# Patient Record
Sex: Male | Born: 1950 | Race: Black or African American | Hispanic: No | Marital: Married | Smoking: Never smoker
Health system: Southern US, Community
[De-identification: ages and names within clinical notes are randomized; demographics above are authoritative.]

## PROBLEM LIST (undated history)

## (undated) DIAGNOSIS — E119 Type 2 diabetes mellitus without complications: Secondary | ICD-10-CM

## (undated) DIAGNOSIS — E7800 Pure hypercholesterolemia, unspecified: Secondary | ICD-10-CM

## (undated) DIAGNOSIS — I2510 Atherosclerotic heart disease of native coronary artery without angina pectoris: Secondary | ICD-10-CM

## (undated) HISTORY — PX: PROSTATE SURGERY: SHX751

---

## 2018-04-22 ENCOUNTER — Other Ambulatory Visit: Payer: Self-pay

## 2018-04-22 ENCOUNTER — Emergency Department (HOSPITAL_BASED_OUTPATIENT_CLINIC_OR_DEPARTMENT_OTHER)
Admission: EM | Admit: 2018-04-22 | Discharge: 2018-04-22 | Disposition: A | Discharge status: Home / Self Care | Payer: Medicare HMO | Attending: Emergency Medicine | Admitting: Emergency Medicine

## 2018-04-22 ENCOUNTER — Encounter (HOSPITAL_BASED_OUTPATIENT_CLINIC_OR_DEPARTMENT_OTHER): Payer: Self-pay | Admitting: Emergency Medicine

## 2018-04-22 ENCOUNTER — Emergency Department (HOSPITAL_BASED_OUTPATIENT_CLINIC_OR_DEPARTMENT_OTHER): Payer: Medicare HMO

## 2018-04-22 DIAGNOSIS — E119 Type 2 diabetes mellitus without complications: Secondary | ICD-10-CM | POA: Insufficient documentation

## 2018-04-22 DIAGNOSIS — Y998 Other external cause status: Secondary | ICD-10-CM | POA: Diagnosis not present

## 2018-04-22 DIAGNOSIS — Y92410 Unspecified street and highway as the place of occurrence of the external cause: Secondary | ICD-10-CM | POA: Diagnosis not present

## 2018-04-22 DIAGNOSIS — Y9389 Activity, other specified: Secondary | ICD-10-CM | POA: Diagnosis not present

## 2018-04-22 DIAGNOSIS — S0990XA Unspecified injury of head, initial encounter: Secondary | ICD-10-CM | POA: Insufficient documentation

## 2018-04-22 DIAGNOSIS — I2510 Atherosclerotic heart disease of native coronary artery without angina pectoris: Secondary | ICD-10-CM | POA: Diagnosis not present

## 2018-04-22 HISTORY — DX: Pure hypercholesterolemia, unspecified: E78.00

## 2018-04-22 HISTORY — DX: Atherosclerotic heart disease of native coronary artery without angina pectoris: I25.10

## 2018-04-22 HISTORY — DX: Type 2 diabetes mellitus without complications: E11.9

## 2018-04-22 MED ORDER — ACETAMINOPHEN 500 MG PO TABS
1000.0000 mg | ORAL_TABLET | Freq: Once | ORAL | Status: AC
  Administered 2018-04-22: 1000 mg via ORAL
  Filled 2018-04-22: qty 2

## 2018-04-22 MED ORDER — IBUPROFEN 600 MG PO TABS: 600.0000 mg | ORAL_TABLET | Freq: Four times a day (QID) | ORAL | 0 refills | Status: AC | PRN
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
## 2018-04-22 NOTE — ED Notes (Signed)
Patient transported to CT 
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
